# Patient Record
Sex: Male | Born: 1990 | Race: White | Hispanic: No | Smoking: Never smoker
Health system: Southern US, Community
[De-identification: ages and names within clinical notes are randomized; demographics above are authoritative.]

## PROBLEM LIST (undated history)

## (undated) DIAGNOSIS — J302 Other seasonal allergic rhinitis: Secondary | ICD-10-CM

## (undated) DIAGNOSIS — Z8659 Personal history of other mental and behavioral disorders: Secondary | ICD-10-CM

## (undated) HISTORY — DX: Personal history of other mental and behavioral disorders: Z86.59

## (undated) HISTORY — DX: Other seasonal allergic rhinitis: J30.2

---

## 2009-11-15 HISTORY — PX: APPENDECTOMY: SHX54

## 2010-04-02 ENCOUNTER — Emergency Department (HOSPITAL_COMMUNITY): Admission: EM | Admit: 2010-04-02 | Discharge: 2010-04-03 | Payer: Self-pay | Admitting: Emergency Medicine

## 2010-06-19 ENCOUNTER — Encounter (INDEPENDENT_AMBULATORY_CARE_PROVIDER_SITE_OTHER): Payer: Self-pay | Admitting: Surgery

## 2010-06-19 ENCOUNTER — Observation Stay (HOSPITAL_COMMUNITY): Admission: EM | Admit: 2010-06-19 | Discharge: 2010-06-20 | Payer: Self-pay | Admitting: Emergency Medicine

## 2010-12-06 ENCOUNTER — Encounter: Payer: Self-pay | Admitting: Pediatrics

## 2011-01-29 LAB — COMPREHENSIVE METABOLIC PANEL
ALT: 13 U/L (ref 0–53)
BUN: 6 mg/dL (ref 6–23)
Calcium: 9.5 mg/dL (ref 8.4–10.5)
Chloride: 102 mEq/L (ref 96–112)
Creatinine, Ser: 0.78 mg/dL (ref 0.4–1.5)
GFR calc Af Amer: >60 mL/min (ref >60)
GFR calc non Af Amer: >60 mL/min (ref >60)
Potassium: 3.5 mEq/L (ref 3.5–5.1)
Sodium: 138 mEq/L (ref 135–145)
Total Protein: 7.8 g/dL (ref 6.0–8.3)

## 2011-01-29 LAB — CBC
HCT: 43.8 % (ref 39.0–52.0)
Hemoglobin: 15 g/dL (ref 13.0–17.0)
MCH: 28.6 pg (ref 26.0–34.0)
MCV: 83.8 fL (ref 78.0–100.0)
Platelets: 177 10*3/uL (ref 150–400)
WBC: 13.7 10*3/uL — ABNORMAL HIGH (ref 4.0–10.5)

## 2011-01-29 LAB — URINALYSIS, ROUTINE W REFLEX MICROSCOPIC
Bilirubin Urine: NEGATIVE
Glucose, UA: NEGATIVE mg/dL
Nitrite: NEGATIVE
Urobilinogen, UA: 0.2 mg/dL (ref 0.0–1.0)
pH: 8 (ref 5.0–8.0)

## 2011-01-29 LAB — DIFFERENTIAL
Basophils Absolute: 0 10*3/uL (ref 0.0–0.1)
Eosinophils Absolute: 0 10*3/uL (ref 0.0–0.7)
Eosinophils Relative: 0 % (ref 0–5)
Lymphs Abs: 0.9 10*3/uL (ref 0.7–4.0)
Neutro Abs: 11.6 10*3/uL — ABNORMAL HIGH (ref 1.7–7.7)
Neutrophils Relative %: 85 % — ABNORMAL HIGH (ref 43–77)

## 2011-01-29 LAB — URINE MICROSCOPIC-ADD ON

## 2011-02-01 LAB — POCT I-STAT, CHEM 8
BUN: 10 mg/dL (ref 6–23)
Chloride: 101 mEq/L (ref 96–112)
Glucose, Bld: 114 mg/dL — ABNORMAL HIGH (ref 70–99)
HCT: 46 % (ref 39.0–52.0)
Hemoglobin: 15.6 g/dL (ref 13.0–17.0)
Potassium: 3.8 mEq/L (ref 3.5–5.1)
Sodium: 135 mEq/L (ref 135–145)

## 2011-02-01 LAB — DIFFERENTIAL
Basophils Absolute: 0 10*3/uL (ref 0.0–0.1)
Eosinophils Absolute: 0 10*3/uL (ref 0.0–0.7)
Eosinophils Relative: 0 % (ref 0–5)
Lymphocytes Relative: 4 % — ABNORMAL LOW (ref 12–46)
Monocytes Absolute: 1.6 10*3/uL — ABNORMAL HIGH (ref 0.1–1.0)
Neutrophils Relative %: 85 % — ABNORMAL HIGH (ref 43–77)

## 2011-02-01 LAB — CBC
HCT: 43.7 % (ref 39.0–52.0)
Hemoglobin: 14.8 g/dL (ref 13.0–17.0)
Platelets: 164 10*3/uL (ref 150–400)
RDW: 15.1 % (ref 11.5–15.5)
WBC: 14.6 10*3/uL — ABNORMAL HIGH (ref 4.0–10.5)

## 2012-11-15 HISTORY — PX: WISDOM TOOTH EXTRACTION: SHX21

## 2013-04-06 ENCOUNTER — Other Ambulatory Visit (HOSPITAL_COMMUNITY): Payer: Self-pay | Admitting: Internal Medicine

## 2013-04-06 ENCOUNTER — Ambulatory Visit (HOSPITAL_COMMUNITY)
Admission: RE | Admit: 2013-04-06 | Discharge: 2013-04-06 | Disposition: A | Discharge status: Home / Self Care | Payer: BC Managed Care – PPO | Source: Ambulatory Visit | Attending: Internal Medicine | Admitting: Internal Medicine

## 2013-04-06 DIAGNOSIS — M79609 Pain in unspecified limb: Secondary | ICD-10-CM

## 2013-04-13 ENCOUNTER — Other Ambulatory Visit: Payer: Self-pay | Admitting: Internal Medicine

## 2013-04-13 DIAGNOSIS — R609 Edema, unspecified: Secondary | ICD-10-CM

## 2013-04-16 ENCOUNTER — Other Ambulatory Visit: Payer: BC Managed Care – PPO

## 2013-10-19 ENCOUNTER — Telehealth: Payer: Self-pay

## 2013-10-19 NOTE — Telephone Encounter (Signed)
The patient is hoping to be worked in as a new pt. His mother stated he was having emotional issues, and she was advised to find him a pcp 1st.  Do you want him worked into your schedule?   Thanks!

## 2013-10-19 NOTE — Telephone Encounter (Signed)
ok 

## 2014-02-16 IMAGING — CR DG TIBIA/FIBULA 2V*L*
2 series · 2 of 2 positions shown · non-contrast
Comparison: None.

CLINICAL DATA: Persistent left lower leg pain following injury 3
weeks ago.

LEFT TIBIA AND FIBULA - 2 VIEW

[view not recorded (1 of 2)]
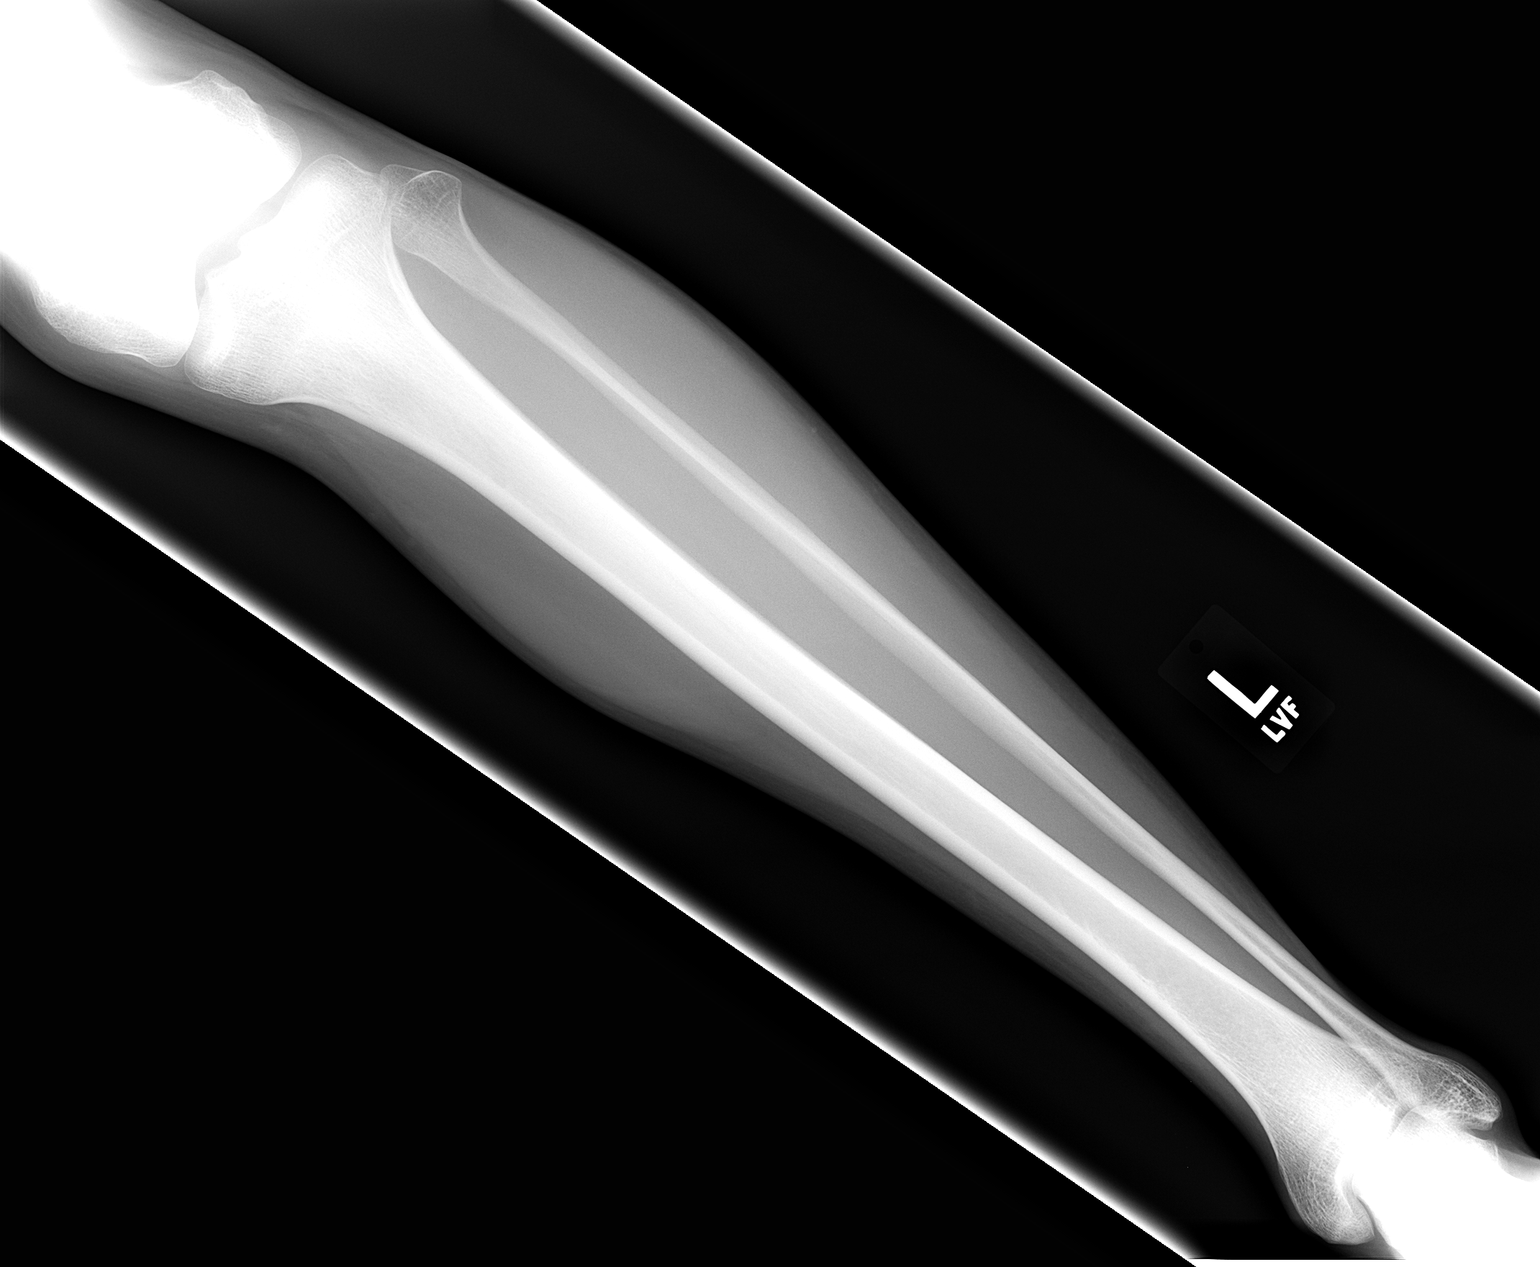

[view not recorded (2 of 2)]
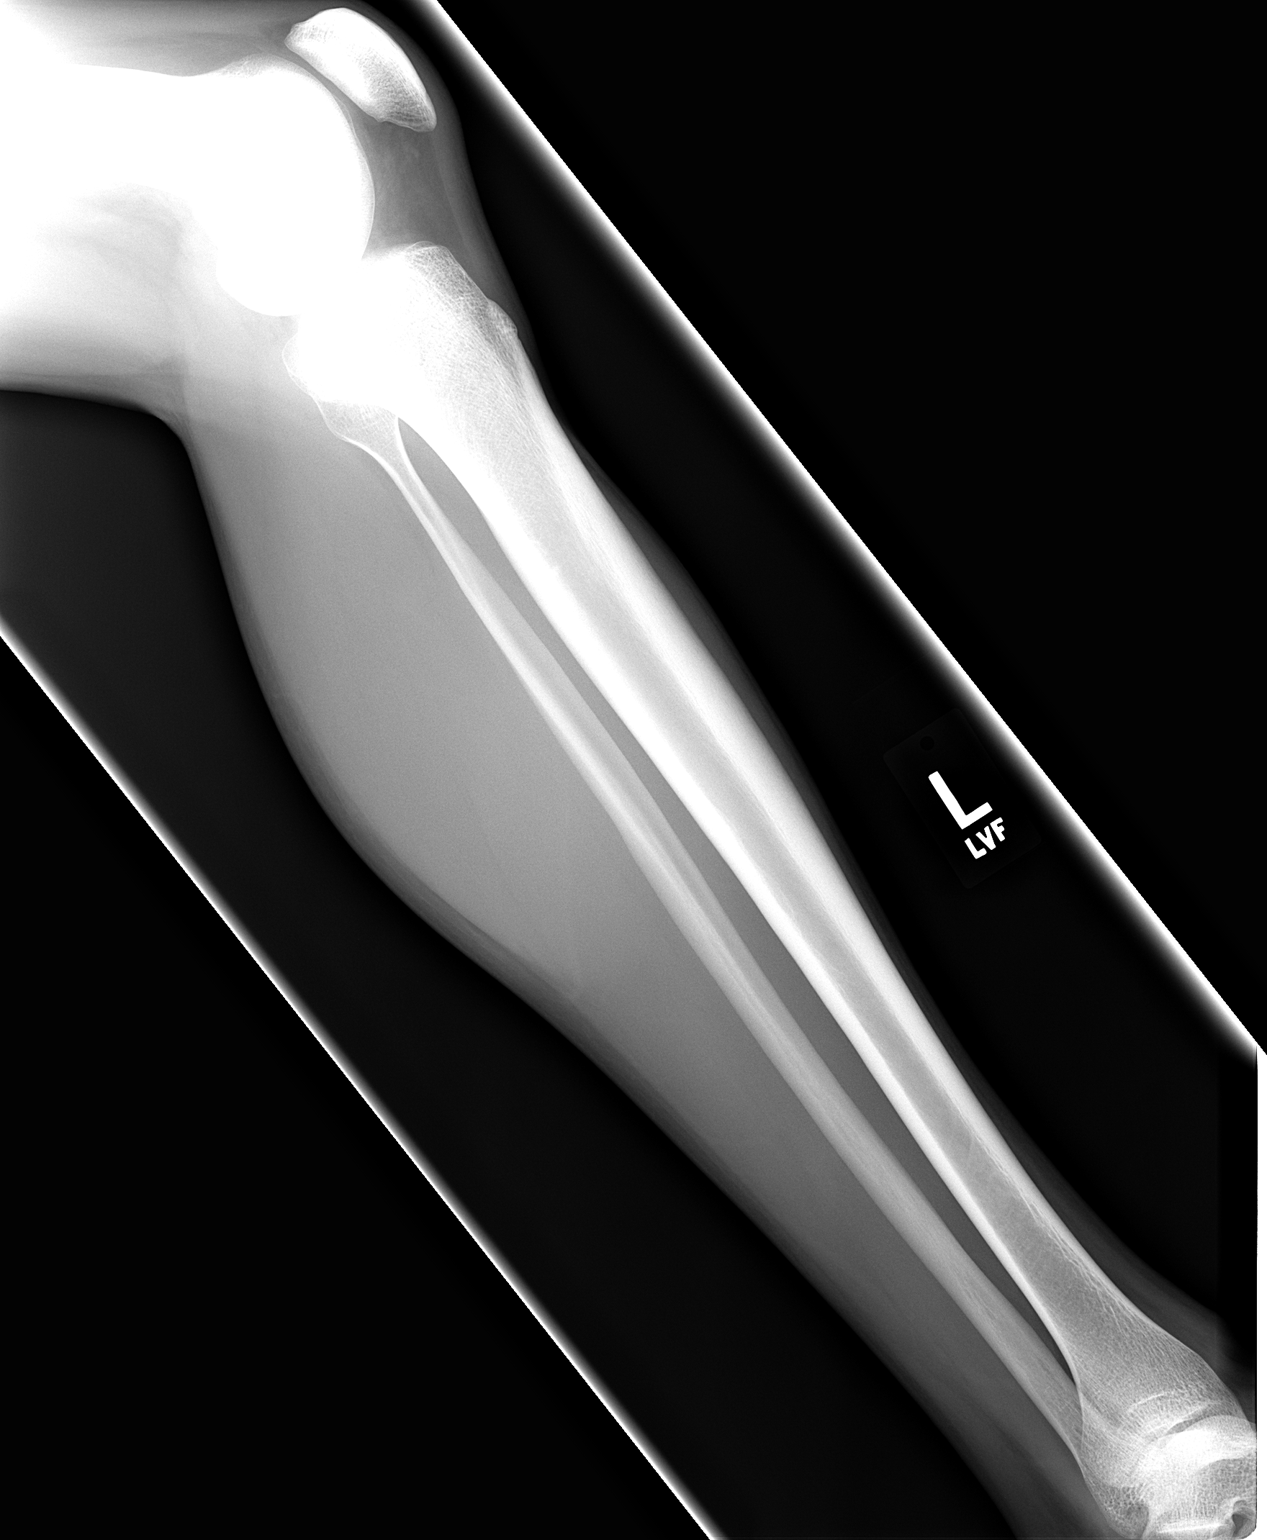

[2 of 2 positions shown; findings below may reference images not displayed]

FINDINGS: The mineralization and alignment are normal.  There is no
evidence of acute fracture or dislocation.  No focal soft tissue
swelling or foreign body is identified.
IMPRESSION: No acute osseous findings.

## 2014-05-29 ENCOUNTER — Ambulatory Visit: Payer: BC Managed Care – PPO | Admitting: Internal Medicine

## 2014-11-05 ENCOUNTER — Encounter: Payer: Self-pay | Admitting: Internal Medicine

## 2014-11-05 ENCOUNTER — Ambulatory Visit (INDEPENDENT_AMBULATORY_CARE_PROVIDER_SITE_OTHER): Payer: BC Managed Care – PPO | Admitting: Internal Medicine

## 2014-11-05 ENCOUNTER — Other Ambulatory Visit (INDEPENDENT_AMBULATORY_CARE_PROVIDER_SITE_OTHER): Payer: BC Managed Care – PPO

## 2014-11-05 VITALS — BP 130/78 | HR 99 | Temp 98.1°F | Ht 69.0 in | Wt 117.0 lb

## 2014-11-05 DIAGNOSIS — F959 Tic disorder, unspecified: Secondary | ICD-10-CM

## 2014-11-05 DIAGNOSIS — Z0000 Encounter for general adult medical examination without abnormal findings: Secondary | ICD-10-CM

## 2014-11-05 LAB — LIPID PANEL
CHOL/HDL RATIO: 4
CHOLESTEROL: 215 mg/dL — AB (ref 0–200)
HDL: 57.6 mg/dL (ref >39.00)
LDL Cholesterol: 144 mg/dL — ABNORMAL HIGH (ref 0–99)
NonHDL: 157.4
Triglycerides: 65 mg/dL (ref 0.0–149.0)
VLDL: 13 mg/dL (ref 0.0–40.0)

## 2014-11-05 LAB — CBC WITH DIFFERENTIAL/PLATELET
BASOS ABS: 0 10*3/uL (ref 0.0–0.1)
Basophils Relative: 0.5 % (ref 0.0–3.0)
EOS PCT: 2.9 % (ref 0.0–5.0)
Eosinophils Absolute: 0.2 10*3/uL (ref 0.0–0.7)
HCT: 46.2 % (ref 39.0–52.0)
Hemoglobin: 15.2 g/dL (ref 13.0–17.0)
Lymphocytes Relative: 20.2 % (ref 12.0–46.0)
Lymphs Abs: 1.6 10*3/uL (ref 0.7–4.0)
MCHC: 33 g/dL (ref 30.0–36.0)
MCV: 85.3 fl (ref 78.0–100.0)
Monocytes Absolute: 0.8 10*3/uL (ref 0.1–1.0)
Monocytes Relative: 10.9 % (ref 3.0–12.0)
NEUTROS ABS: 5.1 10*3/uL (ref 1.4–7.7)
NEUTROS PCT: 65.5 % (ref 43.0–77.0)
Platelets: 225 10*3/uL (ref 150.0–400.0)
RBC: 5.41 Mil/uL (ref 4.22–5.81)
RDW: 14.1 % (ref 11.5–15.5)
WBC: 7.7 10*3/uL (ref 4.0–10.5)

## 2014-11-05 LAB — TSH: TSH: 2.13 u[IU]/mL (ref 0.35–4.50)

## 2014-11-05 LAB — URINALYSIS, ROUTINE W REFLEX MICROSCOPIC
Bilirubin Urine: NEGATIVE
Hgb urine dipstick: NEGATIVE
Ketones, ur: NEGATIVE
Leukocytes, UA: NEGATIVE
NITRITE: NEGATIVE
PH: 6 (ref 5.0–8.0)
RBC / HPF: NONE SEEN (ref 0–?)
Specific Gravity, Urine: 1.015 (ref 1.000–1.030)
TOTAL PROTEIN, URINE-UPE24: NEGATIVE
URINE GLUCOSE: NEGATIVE
UROBILINOGEN UA: 0.2 (ref 0.0–1.0)

## 2014-11-05 LAB — HEPATIC FUNCTION PANEL
ALK PHOS: 47 U/L (ref 39–117)
ALT: 22 U/L (ref 0–53)
AST: 24 U/L (ref 0–37)
Albumin: 4.6 g/dL (ref 3.5–5.2)
Bilirubin, Direct: 0.1 mg/dL (ref 0.0–0.3)
Total Bilirubin: 0.5 mg/dL (ref 0.2–1.2)
Total Protein: 7.7 g/dL (ref 6.0–8.3)

## 2014-11-05 LAB — BASIC METABOLIC PANEL
BUN: 14 mg/dL (ref 6–23)
CALCIUM: 9.4 mg/dL (ref 8.4–10.5)
CHLORIDE: 104 meq/L (ref 96–112)
CO2: 26 meq/L (ref 19–32)
Creatinine, Ser: 0.8 mg/dL (ref 0.4–1.5)
GFR: 127.96 mL/min (ref >60.00)
GLUCOSE: 95 mg/dL (ref 70–99)
POTASSIUM: 4.1 meq/L (ref 3.5–5.1)
SODIUM: 139 meq/L (ref 135–145)

## 2014-11-05 MED ORDER — CLONIDINE HCL 0.1 MG PO TABS: 0.1000 mg | ORAL_TABLET | Freq: Every day | ORAL | Status: DC

## 2014-11-05 NOTE — Patient Instructions (Addendum)
It was good to see you today.  We have reviewed your prior records including labs and tests today  Health Maintenance reviewed - all recommended immunizations and age-appropriate screenings are up-to-date.  Test(s) ordered today. Your results will be released to [place] (or called to you) after review, usually within 72hours after test completion. If any changes need to be made, you will be notified at that same time.  Medications reviewed and updated Will start low-dose clonidine at bedtime to help decrease tics, no other medication changes recommended at this time.  we'll make referral to behavioral health to help evaluate advice in treatment for management of tics. Our office will contact you regarding appointment(s) once made.  Please schedule followup in 3 months for follow up and med review call sooner if problems.  Health Maintenance A healthy lifestyle and preventative care can promote health and wellness.  Maintain regular health, dental, and eye exams.  Eat a healthy diet. Foods like vegetables, fruits, whole grains, low-fat dairy products, and lean protein foods contain the nutrients you need and are low in calories. Decrease your intake of foods high in solid fats, added sugars, and salt. Get information about a proper diet from your health care provider, if necessary.  Regular physical exercise is one of the most important things you can do for your health. Most adults should get at least 150 minutes of moderate-intensity exercise (any activity that increases your heart rate and causes you to sweat) each week. In addition, most adults need muscle-strengthening exercises on 2 or more days a week.   Maintain a healthy weight. The body mass index (BMI) is a screening tool to identify possible weight problems. It provides an estimate of body fat based on height and weight. Your health care provider can find your BMI and can help you achieve or maintain a healthy weight. For males 20  years and older:  A BMI below 18.5 is considered underweight.  A BMI of 18.5 to 24.9 is normal.  A BMI of 25 to 29.9 is considered overweight.  A BMI of 30 and above is considered obese.  Maintain normal blood lipids and cholesterol by exercising and minimizing your intake of saturated fat. Eat a balanced diet with plenty of fruits and vegetables. Blood tests for lipids and cholesterol should begin at age 108 and be repeated every 5 years. If your lipid or cholesterol levels are high, you are over age 55, or you are at high risk for heart disease, you may need your cholesterol levels checked more frequently.Ongoing high lipid and cholesterol levels should be treated with medicines if diet and exercise are not working.  If you smoke, find out from your health care provider how to quit. If you do not use tobacco, do not start.  Lung cancer screening is recommended for adults aged 20-80 years who are at high risk for developing lung cancer because of a history of smoking. A yearly low-dose CT scan of the lungs is recommended for people who have at least a 30-pack-year history of smoking and are current smokers or have quit within the past 15 years. A pack year of smoking is smoking an average of 1 pack of cigarettes a day for 1 year (for example, a 30-pack-year history of smoking could mean smoking 1 pack a day for 30 years or 2 packs a day for 15 years). Yearly screening should continue until the smoker has stopped smoking for at least 15 years. Yearly screening should be stopped for  people who develop a health problem that would prevent them from having lung cancer treatment.  If you choose to drink alcohol, do not have more than 2 drinks per day. One drink is considered to be 12 oz (360 mL) of beer, 5 oz (150 mL) of wine, or 1.5 oz (45 mL) of liquor.  Avoid the use of street drugs. Do not share needles with anyone. Ask for help if you need support or instructions about stopping the use of  drugs.  High blood pressure causes heart disease and increases the risk of stroke. Blood pressure should be checked at least every 1-2 years. Ongoing high blood pressure should be treated with medicines if weight loss and exercise are not effective.  If you are 56-75 years old, ask your health care provider if you should take aspirin to prevent heart disease.  Diabetes screening involves taking a blood sample to check your fasting blood sugar level. This should be done once every 3 years after age 65 if you are at a normal weight and without risk factors for diabetes. Testing should be considered at a younger age or be carried out more frequently if you are overweight and have at least 1 risk factor for diabetes.  Colorectal cancer can be detected and often prevented. Most routine colorectal cancer screening begins at the age of 76 and continues through age 56. However, your health care provider may recommend screening at an earlier age if you have risk factors for colon cancer. On a yearly basis, your health care provider may provide home test kits to check for hidden blood in the stool. A small camera at the end of a tube may be used to directly examine the colon (sigmoidoscopy or colonoscopy) to detect the earliest forms of colorectal cancer. Talk to your health care provider about this at age 77 when routine screening begins. A direct exam of the colon should be repeated every 5-10 years through age 41, unless early forms of precancerous polyps or small growths are found.  People who are at an increased risk for hepatitis B should be screened for this virus. You are considered at high risk for hepatitis B if:  You were born in a country where hepatitis B occurs often. Talk with your health care provider about which countries are considered high risk.  Your parents were born in a high-risk country and you have not received a shot to protect against hepatitis B (hepatitis B vaccine).  You have HIV  or AIDS.  You use needles to inject street drugs.  You live with, or have sex with, someone who has hepatitis B.  You are a man who has sex with other men (MSM).  You get hemodialysis treatment.  You take certain medicines for conditions like cancer, organ transplantation, and autoimmune conditions.  Hepatitis C blood testing is recommended for all people born from [birth year] through 1966 and any individual with known risk factors for hepatitis C.  Healthy men should no longer receive prostate-specific antigen (PSA) blood tests as part of routine cancer screening. Talk to your health care provider about prostate cancer screening.  Testicular cancer screening is not recommended for adolescents or adult males who have no symptoms. Screening includes self-exam, a health care provider exam, and other screening tests. Consult with your health care provider about any symptoms you have or any concerns you have about testicular cancer.  Practice safe sex. Use condoms and avoid high-risk sexual practices to reduce the spread of sexually transmitted  infections (STIs).  You should be screened for STIs, including gonorrhea and chlamydia if:  You are sexually active and are younger than 24 years.  You are older than 24 years, and your health care provider tells you that you are at risk for this type of infection.  Your sexual activity has changed since you were last screened, and you are at an increased risk for chlamydia or gonorrhea. Ask your health care provider if you are at risk.  If you are at risk of being infected with HIV, it is recommended that you take a prescription medicine daily to prevent HIV infection. This is called pre-exposure prophylaxis (PrEP). You are considered at risk if:  You are a man who has sex with other men (MSM).  You are a heterosexual man who is sexually active with multiple partners.  You take drugs by injection.  You are sexually active with a partner who has  HIV.  Talk with your health care provider about whether you are at high risk of being infected with HIV. If you choose to begin PrEP, you should first be tested for HIV. You should then be tested every 3 months for as long as you are taking PrEP.  Use sunscreen. Apply sunscreen liberally and repeatedly throughout the day. You should seek shade when your shadow is shorter than you. Protect yourself by wearing long sleeves, pants, a wide-brimmed hat, and sunglasses year round whenever you are outdoors.  Tell your health care provider of new moles or changes in moles, especially if there is a change in shape or color. Also, tell your health care provider if a mole is larger than the size of a pencil eraser.  A one-time screening for abdominal aortic aneurysm (AAA) and surgical repair of large AAAs by ultrasound is recommended for men aged 25-75 years who are current or former smokers.  Stay current with your vaccines (immunizations). Document Released: 04/29/2008 Document Revised: 11/06/2013 Document Reviewed: 03/29/2011 [facility] Patient Information 2015 [place], [place]. This information is not intended to replace advice given to you by your health care provider. Make sure you discuss any questions you have with your health care provider. Dystonias The dystonias are movement disorders in which sustained muscle contractions cause twisting and repetitive movements or abnormal postures. The movements, which are involuntary and sometimes painful, may affect a single muscle; a group of muscles such as those in the arms, legs, or neck; or the entire body. Early symptoms (problems) may include a deterioration in handwriting after writing several lines, foot cramps, and a tendency of one foot to pull up or drag after running or walking some distance. Other possible symptoms are tremor and voice or speech difficulties. Birth injury (particularly due to lack of oxygen), certain infections, reactions to certain  drugs, heavy-metal or carbon monoxide poisoning, trauma (damage caused by an accident), or stroke can cause dystonic symptoms. About half the cases of dystonia have no connection to disease or injury and are called primary or idiopathic dystonia. Of the primary dystonias, many cases appear to be inherited in a dominant manner. Dystonias can also be symptoms of other diseases, some of which may be hereditary (passed down from parents). In some individuals, symptoms of a dystonia appear spontaneously in childhood between the ages of 66 and 65, usually in the foot or in the hand. For other individuals, the symptoms emerge in late adolescence or early adulthood. TREATMENT  No one treatment has been found universally effective for dystonia. Instead, physicians use a  variety of therapies (medications, surgery and other treatments such as physical therapy, splinting, stress management, and biofeedback), aimed at reducing or eliminating muscle spasms and pain. Since response to drugs varies among patients and even in the same person over time, the therapy must be individualized. PROGNOSIS The initial symptoms can be very mild and may be noticeable only after prolonged exertion, stress, or fatigue. Over a period of time, the symptoms may become more noticeable and widespread and be unrelenting; sometimes, however, there is little or no progression. RESEARCH BEING DONE Investigators believe that the dystonias result from an abnormality in an area of the brain called the basal ganglia, where some of the messages that initiate muscle contractions are processed. Scientists suspect a defect in the body's ability to process a group of chemicals called neurotransmitters that help cells in the brain communicate with each other. Scientists at the [facility] have conducted detailed investigations of the pattern of muscle activity in persons with dystonias. Studies using EEG analysis and neuroimaging are probing brain  activity. The search for the gene or genes responsible for some forms of dominantly inherited dystonias continues. 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 In 1997, the team sequenced the DYT1 gene and found that it codes for a previously unknown protein now called "torsin A." Document Released: 10/22/2002 Document Revised: 01/24/2012 Document Reviewed: 12/26/2013 [facility] Patient Information 2015 [place], [place]. This information is not intended to replace advice given to you by your health care provider. Make sure you discuss any questions you have with your health care provider.

## 2014-11-05 NOTE — Progress Notes (Signed)
Pre visit review using our clinic review tool, if applicable. No additional management support is needed unless otherwise documented below in the visit note. 

## 2014-11-05 NOTE — Progress Notes (Signed)
Subjective:    Patient ID: Howard West, male    DOB: [birth year], 23 y.o.   MRN: [id number]  HPI  New patient to me, here to establish care patient is here today for annual physical. Patient feels well in general -has had health maintenance issues followed at student [facility]  Concerned about nervous tic habit. Same noted by patient, his mother and girlfriend -? Progressively worse in past 12 months. Ongoing symptoms since 23 years of age. Describes behavior as involuntary jerk of head or vocalized "breath" which causes dry mouth. Symptoms worse with stress or sleep deprivation. Denies concurrent anxiety, depression. No prior diagnosis her behavior of OCD or ADHD  Past Medical History  Diagnosis Date  . Seasonal allergies   . History of depression     15-16yo, on meds x 81mo before HS graduation     Family History  Problem Relation Age of Onset  . Osteoarthritis Mother   . Anxiety disorder Mother   . Hyperlipidemia Father   . Alcoholism Father   . Alcohol abuse Maternal Grandfather   . Coronary artery disease Maternal Grandfather   . Congenital heart disease Brother   . Anxiety disorder Sister     History  Substance Use Topics  . Smoking status: Never Smoker   . Smokeless tobacco: Not on file  . Alcohol Use: 0.0 oz/week    0 Not specified per week    Review of Systems  Constitutional: Negative for fever, activity change, appetite change, fatigue and unexpected weight change.  Respiratory: Negative for cough, chest tightness, shortness of breath and wheezing.   Cardiovascular: Negative for chest pain, palpitations and leg swelling.  Neurological: Negative for dizziness, weakness and headaches.  Psychiatric/Behavioral: Negative for suicidal ideas, confusion, sleep disturbance, self-injury, dysphoric mood and decreased concentration. The patient is not nervous/anxious (hx same, but denies current sx).   All other systems reviewed and are negative.      Objective:   Physical Exam  BP 130/78 mmHg  Pulse 99  Temp(Src) 98.1 F (36.7 C) (Oral)  Ht 5\' 9"  (1.753 m)  Wt 117 lb (53.071 kg)  BMI 17.27 kg/m2  SpO2 98% Wt Readings from Last 3 Encounters:  11/05/14 117 lb (53.071 kg)   Constitutional: he is thin, appears well-developed and well-nourished. No distress.  infrequently observed twitch of head or soft auditory "throat clear" HENT: Head: Normocephalic and atraumatic. Ears: B TMs ok, no erythema or effusion; Nose: Nose normal. Mouth/Throat: Oropharynx is clear and moist. No oropharyngeal exudate.  Eyes: Conjunctivae and EOM are normal. Pupils are equal, round, and reactive to light. No scleral icterus.  Neck: Normal range of motion. Neck supple. No JVD present. No thyromegaly present.  Cardiovascular: Normal rate, regular rhythm and normal heart sounds.  No murmur heard. No BLE edema. Pulmonary/Chest: Effort normal and breath sounds normal. No respiratory distress. he has no wheezes.  Abdominal: Soft. Bowel sounds are normal. he exhibits no distension. There is no tenderness. no masses GU: defer Musculoskeletal: Normal range of motion, no joint effusions. No gross deformities Neurological: he is alert and oriented to person, place, and time. No cranial nerve deficit. Coordination, balance, strength, speech and gait are normal.  Skin: Skin is warm and dry. No rash noted. No erythema.  Psychiatric: he has a normal mood and affect. behavior is normal. Judgment and thought content normal.  Lab Results  Component Value Date   WBC 13.7* 06/19/2010   HGB 15.0 06/19/2010   HCT 43.8 06/19/2010  PLT 177 06/19/2010   GLUCOSE 109* 06/19/2010   ALT 13 06/19/2010   AST 18 06/19/2010   NA 138 06/19/2010   K 3.5 06/19/2010   CL 102 06/19/2010   CREATININE 0.78 06/19/2010   BUN 6 06/19/2010   CO2 25 06/19/2010    Dg Tibia/fibula Left  04/06/2013   *RADIOLOGY REPORT*  Clinical Data: Persistent left lower leg pain following injury 3 weeks ago.  LEFT  TIBIA AND FIBULA - 2 VIEW  Comparison: None.  Findings: The mineralization and alignment are normal.  There is no evidence of acute fracture or dislocation.  No focal soft tissue swelling or foreign body is identified.  IMPRESSION: No acute osseous findings.   Original Report Authenticated By: Carey BullocksWilliam Veazey, M.D.       Assessment & Plan:   CPX/z00.00 - Patient has been counseled on age-appropriate routine health concerns for screening and prevention. These are reviewed and up-to-date. Immunizations are up-to-date or declined. Labs ordered and reviewed.  Problem List Items Addressed This Visit    Tic disorder    Ongoing symptoms since age 23 or [age]. Manifest with head twitch and soft vocalization/throat clearing which causes dry throat. Refer for behavioral therapy. Begin low-dose clonidine at bedtime for adrenergic management. Further med titration or adjustment per conjunction with behavioral health advice -reassurance provided, education provided    Relevant Orders      Ambulatory referral to Psychology    Other Visit Diagnoses    Routine general medical examination at a health care facility    -  Primary    Relevant Orders       Basic metabolic panel       Hepatic function panel       Lipid panel       Urinalysis, Routine w reflex microscopic       TSH       CBC with Differential

## 2014-11-05 NOTE — Assessment & Plan Note (Signed)
Ongoing symptoms since age 23 or [age]. Manifest with head twitch and soft vocalization/throat clearing which causes dry throat. Refer for behavioral therapy. Begin low-dose clonidine at bedtime for adrenergic management. Further med titration or adjustment per conjunction with behavioral health advice -reassurance provided, education provided

## 2014-11-26 ENCOUNTER — Ambulatory Visit (INDEPENDENT_AMBULATORY_CARE_PROVIDER_SITE_OTHER): Payer: BLUE CROSS/BLUE SHIELD | Admitting: Psychiatry

## 2014-11-26 DIAGNOSIS — F959 Tic disorder, unspecified: Secondary | ICD-10-CM

## 2014-11-27 ENCOUNTER — Telehealth: Payer: Self-pay | Admitting: Internal Medicine

## 2014-11-27 NOTE — Telephone Encounter (Signed)
Pt called in said that he seen therapist and he is requesting something for depression

## 2014-11-28 MED ORDER — FLUOXETINE HCL 20 MG PO TABS
20.0000 mg | ORAL_TABLET | Freq: Every day | ORAL | Status: DC
Start: 2014-11-28 — End: 2015-02-04

## 2014-11-28 NOTE — Telephone Encounter (Signed)
Pt informed of erx

## 2014-11-28 NOTE — Telephone Encounter (Signed)
Thanks for the message - Ok to start generic prozac 20mg  daily -= erx to pharmacy

## 2015-02-03 ENCOUNTER — Ambulatory Visit: Payer: BC Managed Care – PPO | Admitting: Internal Medicine

## 2015-02-04 ENCOUNTER — Encounter: Payer: Self-pay | Admitting: Internal Medicine

## 2015-02-04 ENCOUNTER — Ambulatory Visit (INDEPENDENT_AMBULATORY_CARE_PROVIDER_SITE_OTHER): Payer: BLUE CROSS/BLUE SHIELD | Admitting: Internal Medicine

## 2015-02-04 VITALS — BP 114/60 | HR 83 | Temp 98.2°F | Resp 14 | Ht 70.0 in | Wt 113.4 lb

## 2015-02-04 DIAGNOSIS — F959 Tic disorder, unspecified: Secondary | ICD-10-CM

## 2015-02-04 MED ORDER — FLUOXETINE HCL 20 MG PO TABS: 20.0000 mg | ORAL_TABLET | Freq: Every day | ORAL | Status: DC

## 2015-02-04 NOTE — Progress Notes (Signed)
Pre visit review using our clinic review tool, if applicable. No additional management support is needed unless otherwise documented below in the visit note. 

## 2015-02-04 NOTE — Patient Instructions (Signed)
Come back in about 6 months to follow up. If you have any problems or questions before then please call our office back.

## 2015-02-04 NOTE — Assessment & Plan Note (Addendum)
Doing well with prozac and not taking the clonidine (will remove from medication list). Refilled for 6 months and advised follow up in about 6 months to see if he needs any adjustment. No dosing adjustment today.

## 2015-02-04 NOTE — Progress Notes (Signed)
   Subjective:    Patient ID: Howard FiddlerJames A West, male    DOB: [birth year], 24 y.o.   MRN: [id number]  HPI The patient is a 24 YO man who is coming in to follow up on his tic disorder. He is taking prozac now and feels like this is helping some. He denies any adverse side effects from the medications. Less tics and more controllable at this time. Denies weight gain or loss. Denies sexual dysfunction. Rates his tics as mild now from moderate. Happy with where he is.   Review of Systems  Constitutional: Negative for fever, activity change, appetite change, fatigue and unexpected weight change.  Respiratory: Negative.   Neurological: Negative.   Psychiatric/Behavioral: Negative.       Objective:   Physical Exam  Constitutional: He appears well-developed and well-nourished.  No tic during our conversation  HENT:  Head: Normocephalic and atraumatic.  Cardiovascular: Normal rate and regular rhythm.   Pulmonary/Chest: Effort normal and breath sounds normal.  Psychiatric: He has a normal mood and affect.   Filed Vitals:   02/04/15 0813  BP: 114/60  Pulse: 83  Temp: 98.2 F (36.8 C)  TempSrc: Oral  Resp: 14  Height: 5\' 10"  (1.778 m)  Weight: 113 lb 6.4 oz (51.438 kg)  SpO2: 99%      Assessment & Plan:

## 2015-08-11 ENCOUNTER — Ambulatory Visit: Payer: BLUE CROSS/BLUE SHIELD | Admitting: Internal Medicine

## 2015-08-14 ENCOUNTER — Encounter: Payer: Self-pay | Admitting: Internal Medicine

## 2015-08-14 ENCOUNTER — Ambulatory Visit (INDEPENDENT_AMBULATORY_CARE_PROVIDER_SITE_OTHER): Payer: BLUE CROSS/BLUE SHIELD | Admitting: Internal Medicine

## 2015-08-14 VITALS — BP 128/60 | HR 108 | Temp 98.6°F | Resp 12 | Ht 68.0 in | Wt 121.0 lb

## 2015-08-14 DIAGNOSIS — F959 Tic disorder, unspecified: Secondary | ICD-10-CM | POA: Diagnosis not present

## 2015-08-14 MED ORDER — FLUOXETINE HCL 20 MG PO TABS: 20.0000 mg | ORAL_TABLET | Freq: Every day | ORAL | Status: DC

## 2015-08-14 NOTE — Progress Notes (Signed)
Pre visit review using our clinic review tool, if applicable. No additional management support is needed unless otherwise documented below in the visit note. 

## 2015-08-14 NOTE — Assessment & Plan Note (Signed)
Doing well on prozac still. Refilled for 1 year. No labs indicated today.

## 2015-08-14 NOTE — Patient Instructions (Signed)
Come back in about 1 year

## 2015-08-14 NOTE — Progress Notes (Signed)
   Subjective:    Patient ID: Howard West, male    DOB: Oct 19, 1991, 24 y.o.   MRN: [id number]  HPI The patient is a 24 YO man coming in for follow up of his tic disorder. Doing well with prozac. No complaints. No side effects. Happy with the results. Does not take flu shot. No new conditions or injuries.   Review of Systems  Constitutional: Negative for fever, activity change, appetite change, fatigue and unexpected weight change.  Respiratory: Negative.   Cardiovascular: Negative.   Gastrointestinal: Negative.   Musculoskeletal: Negative.   Neurological: Negative.   Psychiatric/Behavioral: Negative.       Objective:   Physical Exam  Constitutional: He is oriented to person, place, and time. He appears well-developed and well-nourished.  No tic during our conversation  HENT:  Head: Normocephalic and atraumatic.  Cardiovascular: Normal rate and regular rhythm.   Pulmonary/Chest: Effort normal and breath sounds normal.  Abdominal: Soft. He exhibits no distension. There is no tenderness. There is no rebound.  Musculoskeletal: He exhibits no edema.  Neurological: He is alert and oriented to person, place, and time. Coordination normal.  Skin: Skin is warm and dry.  Psychiatric: He has a normal mood and affect.   Filed Vitals:   08/14/15 0849  BP: 128/60  Pulse: 108  Temp: 98.6 F (37 C)  TempSrc: Oral  Resp: 12  Height:  (1.727 m)  Weight: 121 lb (54.885 kg)  SpO2: 98%      Assessment & Plan:

## 2015-08-15 ENCOUNTER — Encounter: Payer: Self-pay | Admitting: Internal Medicine

## 2015-08-15 ENCOUNTER — Ambulatory Visit (INDEPENDENT_AMBULATORY_CARE_PROVIDER_SITE_OTHER): Payer: BLUE CROSS/BLUE SHIELD | Admitting: Internal Medicine

## 2015-08-15 VITALS — BP 114/68 | HR 110 | Temp 98.7°F | Wt 118.0 lb

## 2015-08-15 DIAGNOSIS — K529 Noninfective gastroenteritis and colitis, unspecified: Secondary | ICD-10-CM | POA: Diagnosis not present

## 2015-08-15 MED ORDER — ONDANSETRON HCL 4 MG PO TABS: 4.0000 mg | ORAL_TABLET | Freq: Three times a day (TID) | ORAL | Status: DC | PRN

## 2015-08-15 MED ORDER — LOPERAMIDE HCL 2 MG PO TABS: 2.0000 mg | ORAL_TABLET | Freq: Four times a day (QID) | ORAL | Status: DC | PRN

## 2015-08-15 NOTE — Progress Notes (Signed)
Subjective:  Patient ID: Howard West, male    DOB: 1991-01-28  Age: 24 y.o. MRN: [id number]  CC: No chief complaint on file.   HPI KAINON VARADY presents for n/v/d since last night. Feeling weak. He is s/p appendectomy  Outpatient Prescriptions Prior to Visit  Medication Sig Dispense Refill  . FLUoxetine (PROZAC) 20 MG tablet Take 1 tablet (20 mg total) by mouth daily. 30 tablet 11   No facility-administered medications prior to visit.    ROS Review of Systems  Constitutional: Positive for chills and fatigue. Negative for appetite change and unexpected weight change.  HENT: Negative for congestion, nosebleeds, sneezing, sore throat and trouble swallowing.   Eyes: Negative for itching and visual disturbance.  Respiratory: Negative for cough.   Cardiovascular: Negative for chest pain, palpitations and leg swelling.  Gastrointestinal: Positive for nausea, vomiting, abdominal pain and diarrhea. Negative for blood in stool, abdominal distention, anal bleeding and rectal pain.  Genitourinary: Negative for frequency and hematuria.  Musculoskeletal: Negative for back pain, joint swelling, gait problem and neck pain.  Skin: Negative for rash.  Neurological: Negative for dizziness, tremors, speech difficulty and weakness.  Psychiatric/Behavioral: Negative for suicidal ideas, sleep disturbance, dysphoric mood and agitation. The patient is not nervous/anxious.     Objective:  BP 114/68 mmHg  Pulse 110  Temp(Src) 98.7 F (37.1 C) (Oral)  Wt 118 lb (53.524 kg)  SpO2 97%  BP Readings from Last 3 Encounters:  08/15/15 114/68  08/14/15 128/60  02/04/15 114/60    Wt Readings from Last 3 Encounters:  08/15/15 118 lb (53.524 kg)  08/14/15 121 lb (54.885 kg)  02/04/15 113 lb 6.4 oz (51.438 kg)    Physical Exam  Constitutional: He is oriented to person, place, and time. He appears well-developed. No distress.  NAD  HENT:  Mouth/Throat: Oropharynx is clear and moist.  Eyes:  Conjunctivae are normal. Pupils are equal, round, and reactive to light.  Neck: Normal range of motion. No JVD present. No thyromegaly present.  Cardiovascular: Normal rate, regular rhythm, normal heart sounds and intact distal pulses.  Exam reveals no gallop and no friction rub.   No murmur heard. Pulmonary/Chest: Effort normal and breath sounds normal. No respiratory distress. He has no wheezes. He has no rales. He exhibits no tenderness.  Abdominal: Soft. Bowel sounds are normal. He exhibits no distension and no mass. There is tenderness. There is no rebound and no guarding.  Musculoskeletal: Normal range of motion. He exhibits no edema or tenderness.  Lymphadenopathy:    He has no cervical adenopathy.  Neurological: He is alert and oriented to person, place, and time. He has normal reflexes. No cranial nerve deficit. He exhibits normal muscle tone. He displays a negative Romberg sign. Coordination and gait normal.  Skin: Skin is warm and dry. No rash noted.  Psychiatric: He has a normal mood and affect. His behavior is normal. Judgment and thought content normal.  sensitive abdomen to palpation Looks tired  Lab Results  Component Value Date   WBC 7.7 11/05/2014   HGB 15.2 11/05/2014   HCT 46.2 11/05/2014   PLT 225.0 11/05/2014   GLUCOSE 95 11/05/2014   CHOL 215* 11/05/2014   TRIG 65.0 11/05/2014   HDL 57.60 11/05/2014   LDLCALC 144* 11/05/2014   ALT 22 11/05/2014   AST 24 11/05/2014   NA 139 11/05/2014   K 4.1 11/05/2014   CL 104 11/05/2014   CREATININE 0.8 11/05/2014   BUN 14 11/05/2014  CO2 26 11/05/2014   TSH 2.13 11/05/2014    Dg Tibia/fibula Left  04/06/2013   *RADIOLOGY REPORT*  Clinical Data: Persistent left lower leg pain following injury 3 weeks ago.  LEFT TIBIA AND FIBULA - 2 VIEW  Comparison: None.  Findings: The mineralization and alignment are normal.  There is no evidence of acute fracture or dislocation.  No focal soft tissue swelling or foreign body is  identified.  IMPRESSION: No acute osseous findings.   Original Report Authenticated By: Carey Bullocks, M.D.    Assessment & Plan:   Diagnoses and all orders for this visit:  Gastroenteritis  Other orders -     ondansetron (ZOFRAN) 4 MG tablet; Take 1 tablet (4 mg total) by mouth every 8 (eight) hours as needed for nausea or vomiting. -     loperamide (IMODIUM A-D) 2 MG tablet; Take 1-2 tablets (2-4 mg total) by mouth 4 (four) times daily as needed for diarrhea or loose stools.   I am having Mr. Vittorio start on ondansetron and loperamide. I am also having him maintain his FLUoxetine.  Meds ordered this encounter  Medications  . ondansetron (ZOFRAN) 4 MG tablet    Sig: Take 1 tablet (4 mg total) by mouth every 8 (eight) hours as needed for nausea or vomiting.    Dispense:  20 tablet    Refill:  0  . loperamide (IMODIUM A-D) 2 MG tablet    Sig: Take 1-2 tablets (2-4 mg total) by mouth 4 (four) times daily as needed for diarrhea or loose stools.    Dispense:  30 tablet    Refill:  0     Follow-up: No Follow-up on file.  Sonda Primes, MD

## 2015-08-15 NOTE — Patient Instructions (Signed)
Food Poisoning °Food poisoning is an illness caused by something you ate or drank. There are over 250 known causes of food poisoning. However, many other causes are unknown. You can be treated even if the exact cause of your food poisoning is not known. In most cases, food poisoning is mild and lasts 1 to 2 days. However, some cases can be serious, especially for people with low immune systems, the elderly, children and infants, and pregnant women. °CAUSES  °Poor personal hygiene, improper cleaning of storage and preparation areas, and unclean utensils can cause infection or tainting (contamination) of foods. The causes of food poisoning are numerous. Infectious agents, such as viruses, bacteria, or parasites, can cause harm by infecting the intestine and disrupting the absorption of nutrients and water. This can cause diarrhea and lead to dehydration. Viruses are responsible for most of the food poisonings in which an agent is found. Parasites are less likely to cause food poisoning. Toxic agents, such as poisonous mushrooms, marine algae, and pesticides can also cause food poisoning. °· Viral causes of food poisoning include: °¨ Norovirus. °¨ Rotavirus. °¨ Hepatitis A. °· Bacterial causes of food poisoning include: °¨ Salmonellae. °¨ Campylobacter. °¨ Bacillus cereus. °¨ Escherichia coli (E. coli). °¨ Shigella. °¨ Listeria monocytogenes. °¨ Clostridium botulinum (botulism). °¨ Vibrio cholerae. °· Parasites that can cause food poisoning include: °¨ Giardia. °¨ Cryptosporidium. °¨ Toxoplasma. °SYMPTOMS °Symptoms may appear several hours or longer after consuming the contaminated food or drink. Symptoms may include: °· Nausea. °· Vomiting. °· Cramping. °· Diarrhea. °· Fever and chills. °· Muscle aches. °DIAGNOSIS °Your health care provider may be able to diagnose food poisoning from a list of what you have recently eaten and results from lab tests. Diagnostic tests may include an exam of the feces. °TREATMENT °In  most cases, treatment focuses on helping to relieve your symptoms and staying well hydrated. Antibiotic medicines are rarely needed. In severe cases, hospitalization may be required. °HOME CARE INSTRUCTIONS  °· Drink enough water and fluids to keep your urine clear or pale yellow. Drink small amounts of fluids frequently and increase as tolerated. °· Ask your health care provider for specific rehydration instructions. °· Avoid: °¨ Foods high in sugar. °¨ Alcohol. °¨ Carbonated drinks. °¨ Tobacco. °¨ Juice. °¨ Caffeine drinks. °¨ Extremely hot or cold fluids. °¨ Fatty, greasy foods. °¨ Too much intake of anything at one time. °¨ Dairy products until 24 to 48 hours after diarrhea stops. °· You may consume probiotics. Probiotics are active cultures of beneficial bacteria. They may lessen the amount and number of diarrheal stools in adults. Probiotics can be found in yogurt with active cultures and in supplements. °· Wash your hands well to avoid spreading the bacteria. °· Take medicines only as directed by your health care provider. Do not give your child aspirin because of the association with Reye's syndrome. °· Ask your health care provider if you should continue to take your regular prescribed and over-the-counter medicines. °PREVENTION  °· Wash your hands, food preparation surfaces, and utensils thoroughly before and after handling raw foods. °· Keep refrigerated foods below 40°F (5°C). °· Serve hot foods immediately or keep them heated above 140°F (60°C). °· Divide large volumes of food into small portions for rapid cooling in the refrigerator. Hot, bulky foods in the refrigerator can raise the temperature of other foods that have already cooled. °· Follow approved canning procedures. °· Heat canned foods thoroughly before tasting. °· When in doubt, throw it out. °· Infants, the elderly, women   who are pregnant, and people with compromised immune systems are especially susceptible to food poisoning. These people  should never consume unpasteurized cheese, unpasteurized cider, raw fish, raw seafood, or raw meat-type products. °SEEK IMMEDIATE MEDICAL CARE IF:  °· You have difficulty breathing, swallowing, talking, or moving. °· You develop blurred vision. °· You are unable to keep fluids down. °· You faint or nearly faint. °· Your eyes turn yellow. °· Vomiting or diarrhea develops or becomes persistent. °· Abdominal pain develops, increases, or localizes in one small area. °· You have a fever. °· The diarrhea becomes excessive or contains blood or mucus. °· You develop excessive weakness, dizziness, or extreme thirst. °· You have no urine for 8 hours. °MAKE SURE YOU:  °· Understand these instructions. °· Will watch your condition. °· Will get help right away if you are not doing well or get worse. °Document Released: 07/30/2004 Document Revised: 03/18/2014 Document Reviewed: 03/18/2011 °Exit[employer]® Patient Information ©2015 [employer]. This information is not intended to replace advice given to you by your health care provider. Make sure you discuss any questions you have with your health care provider. ° °

## 2015-08-15 NOTE — Progress Notes (Signed)
Pre visit review using our clinic review tool, if applicable. No additional management support is needed unless otherwise documented below in the visit note. 

## 2016-05-25 ENCOUNTER — Other Ambulatory Visit: Payer: Self-pay | Admitting: *Deleted

## 2016-05-25 MED ORDER — FLUOXETINE HCL 20 MG PO TABS: 20.0000 mg | ORAL_TABLET | Freq: Every day | ORAL | Status: DC

## 2016-08-13 ENCOUNTER — Ambulatory Visit (INDEPENDENT_AMBULATORY_CARE_PROVIDER_SITE_OTHER): Payer: BLUE CROSS/BLUE SHIELD | Admitting: Internal Medicine

## 2016-08-13 ENCOUNTER — Encounter: Payer: Self-pay | Admitting: Internal Medicine

## 2016-08-13 DIAGNOSIS — Z0000 Encounter for general adult medical examination without abnormal findings: Secondary | ICD-10-CM

## 2016-08-13 DIAGNOSIS — F959 Tic disorder, unspecified: Secondary | ICD-10-CM | POA: Diagnosis not present

## 2016-08-13 MED ORDER — FLUOXETINE HCL 20 MG PO TABS: 20.0000 mg | ORAL_TABLET | Freq: Every day | ORAL | 3 refills | Status: AC

## 2016-08-13 NOTE — Progress Notes (Signed)
   Subjective:    Patient ID: Melina FiddlerJames A Bennis, male    DOB: [birth year], 25 y.o.   MRN: [id number]  HPI The patient is a 25 YO man coming in for wellness. No new concerns.   [facility], social history reviewed and updated.   Review of Systems  Constitutional: Negative for activity change, appetite change, fatigue, fever and unexpected weight change.  HENT: Negative.   Eyes: Negative.   Respiratory: Negative.   Cardiovascular: Negative.   Gastrointestinal: Negative.   Musculoskeletal: Negative.   Skin: Negative.   Neurological: Negative.   Psychiatric/Behavioral: Negative.       Objective:   Physical Exam  Constitutional: He is oriented to person, place, and time. He appears well-developed and well-nourished.  No tic during our conversation  HENT:  Head: Normocephalic and atraumatic.  Cardiovascular: Normal rate and regular rhythm.   Pulmonary/Chest: Effort normal and breath sounds normal.  Abdominal: Soft. He exhibits no distension. There is no tenderness. There is no rebound.  Musculoskeletal: He exhibits no edema.  Neurological: He is alert and oriented to person, place, and time. Coordination normal.  Skin: Skin is warm and dry.  Psychiatric: He has a normal mood and affect.   Vitals:   08/13/16 0937  BP: 136/66  Pulse: (!) 112  Resp: 16  Temp: 98.4 F (36.9 C)  TempSrc: Oral  SpO2: 98%  Weight: 141 lb 12.8 oz (64.3 kg)  Height: 5\' 9"  (1.753 m)      Assessment & Plan:

## 2016-08-13 NOTE — Patient Instructions (Signed)
We have sent in the refill today.   Think about doing some exercise like walking 30 minutes a day 3 times per week to stay healthy.   Health Maintenance, Male A healthy lifestyle and preventative care can promote health and wellness.  Maintain regular health, dental, and eye exams.  Eat a healthy diet. Foods like vegetables, fruits, whole grains, low-fat dairy products, and lean protein foods contain the nutrients you need and are low in calories. Decrease your intake of foods high in solid fats, added sugars, and salt. Get information about a proper diet from your health care provider, if necessary.  Regular physical exercise is one of the most important things you can do for your health. Most adults should get at least 150 minutes of moderate-intensity exercise (any activity that increases your heart rate and causes you to sweat) each week. In addition, most adults need muscle-strengthening exercises on 2 or more days a week.   Maintain a healthy weight. The body mass index (BMI) is a screening tool to identify possible weight problems. It provides an estimate of body fat based on height and weight. Your health care provider can find your BMI and can help you achieve or maintain a healthy weight. For males 20 years and older:  A BMI below 18.5 is considered underweight.  A BMI of 18.5 to 24.9 is normal.  A BMI of 25 to 29.9 is considered overweight.  A BMI of 30 and above is considered obese.  Maintain normal blood lipids and cholesterol by exercising and minimizing your intake of saturated fat. Eat a balanced diet with plenty of fruits and vegetables. Blood tests for lipids and cholesterol should begin at age 26 and be repeated every 5 years. If your lipid or cholesterol levels are high, you are over age 26, or you are at high risk for heart disease, you may need your cholesterol levels checked more frequently.Ongoing high lipid and cholesterol levels should be treated with medicines if  diet and exercise are not working.  If you smoke, find out from your health care provider how to quit. If you do not use tobacco, do not start.  Lung cancer screening is recommended for adults aged 55-80 years who are at high risk for developing lung cancer because of a history of smoking. A yearly low-dose CT scan of the lungs is recommended for people who have at least a 30-pack-year history of smoking and are current smokers or have quit within the past 15 years. A pack year of smoking is smoking an average of 1 pack of cigarettes a day for 1 year (for example, a 30-pack-year history of smoking could mean smoking 1 pack a day for 30 years or 2 packs a day for 15 years). Yearly screening should continue until the smoker has stopped smoking for at least 15 years. Yearly screening should be stopped for people who develop a health problem that would prevent them from having lung cancer treatment.  If you choose to drink alcohol, do not have more than 2 drinks per day. One drink is considered to be 12 oz (360 mL) of beer, 5 oz (150 mL) of wine, or 1.5 oz (45 mL) of liquor.  Avoid the use of street drugs. Do not share needles with anyone. Ask for help if you need support or instructions about stopping the use of drugs.  High blood pressure causes heart disease and increases the risk of stroke. High blood pressure is more likely to develop in:  People who have blood pressure in the end of the normal range (100-139/85-89 mm Hg).  People who are overweight or obese.  People who are African American.  If you are 60-4 years of age, have your blood pressure checked every 3-5 years. If you are 65 years of age or older, have your blood pressure checked every year. You should have your blood pressure measured twice--once when you are at a hospital or clinic, and once when you are not at a hospital or clinic. Record the average of the two measurements. To check your blood pressure when you are not at a  hospital or clinic, you can use:  An automated blood pressure machine at a pharmacy.  A home blood pressure monitor.  If you are 12-49 years old, ask your health care provider if you should take aspirin to prevent heart disease.  Diabetes screening involves taking a blood sample to check your fasting blood sugar level. This should be done once every 3 years after age 42 if you are at a normal weight and without risk factors for diabetes. Testing should be considered at a younger age or be carried out more frequently if you are overweight and have at least 1 risk factor for diabetes.  Colorectal cancer can be detected and often prevented. Most routine colorectal cancer screening begins at the age of 56 and continues through age 41. However, your health care provider may recommend screening at an earlier age if you have risk factors for colon cancer. On a yearly basis, your health care provider may provide home test kits to check for hidden blood in the stool. A small camera at the end of a tube may be used to directly examine the colon (sigmoidoscopy or colonoscopy) to detect the earliest forms of colorectal cancer. Talk to your health care provider about this at age 84 when routine screening begins. A direct exam of the colon should be repeated every 5-10 years through age 60, unless early forms of precancerous polyps or small growths are found.  People who are at an increased risk for hepatitis B should be screened for this virus. You are considered at high risk for hepatitis B if:  You were born in a country where hepatitis B occurs often. Talk with your health care provider about which countries are considered high risk.  Your parents were born in a high-risk country and you have not received a shot to protect against hepatitis B (hepatitis B vaccine).  You have HIV or AIDS.  You use needles to inject street drugs.  You live with, or have sex with, someone who has hepatitis B.  You are a  man who has sex with other men (MSM).  You get hemodialysis treatment.  You take certain medicines for conditions like cancer, organ transplantation, and autoimmune conditions.  Hepatitis C blood testing is recommended for all people born from [birth year] through 1965 and any individual with known risk factors for hepatitis C.  Healthy men should no longer receive prostate-specific antigen (PSA) blood tests as part of routine cancer screening. Talk to your health care provider about prostate cancer screening.  Testicular cancer screening is not recommended for adolescents or adult males who have no symptoms. Screening includes self-exam, a health care provider exam, and other screening tests. Consult with your health care provider about any symptoms you have or any concerns you have about testicular cancer.  Practice safe sex. Use condoms and avoid high-risk sexual practices to reduce the spread of  sexually transmitted infections (STIs).  You should be screened for STIs, including gonorrhea and chlamydia if:  You are sexually active and are younger than 24 years.  You are older than 24 years, and your health care provider tells you that you are at risk for this type of infection.  Your sexual activity has changed since you were last screened, and you are at an increased risk for chlamydia or gonorrhea. Ask your health care provider if you are at risk.  If you are at risk of being infected with HIV, it is recommended that you take a prescription medicine daily to prevent HIV infection. This is called pre-exposure prophylaxis (PrEP). You are considered at risk if:  You are a man who has sex with other men (MSM).  You are a heterosexual man who is sexually active with multiple partners.  You take drugs by injection.  You are sexually active with a partner who has HIV.  Talk with your health care provider about whether you are at high risk of being infected with HIV. If you choose to begin PrEP,  you should first be tested for HIV. You should then be tested every 3 months for as long as you are taking PrEP.  Use sunscreen. Apply sunscreen liberally and repeatedly throughout the day. You should seek shade when your shadow is shorter than you. Protect yourself by wearing long sleeves, pants, a wide-brimmed hat, and sunglasses year round whenever you are outdoors.  Tell your health care provider of new moles or changes in moles, especially if there is a change in shape or color. Also, tell your health care provider if a mole is larger than the size of a pencil eraser.  A one-time screening for abdominal aortic aneurysm (AAA) and surgical repair of large AAAs by ultrasound is recommended for men aged 78-75 years who are current or former smokers.  Stay current with your vaccines (immunizations).   This information is not intended to replace advice given to you by your health care provider. Make sure you discuss any questions you have with your health care provider.   Document Released: 04/29/2008 Document Revised: 11/22/2014 Document Reviewed: 03/29/2011 Elsevier Interactive Patient Education [employer].

## 2016-08-13 NOTE — Assessment & Plan Note (Signed)
Declines flu shot, not exercising and counseled about that and sun safety with mole surveillance. Given screening recommendations and counseled about distracted driving.

## 2016-08-13 NOTE — Assessment & Plan Note (Signed)
Stable on prozac.  

## 2016-08-13 NOTE — Progress Notes (Signed)
Pre visit review using our clinic review tool, if applicable. No additional management support is needed unless otherwise documented below in the visit note. 

## 2019-08-18 ENCOUNTER — Encounter (INDEPENDENT_AMBULATORY_CARE_PROVIDER_SITE_OTHER): Payer: Self-pay

## 2019-08-18 ENCOUNTER — Telehealth: Payer: BLUE CROSS/BLUE SHIELD | Admitting: Nurse Practitioner

## 2019-08-18 DIAGNOSIS — Z20822 Contact with and (suspected) exposure to covid-19: Secondary | ICD-10-CM

## 2019-08-18 MED ORDER — BENZONATATE 100 MG PO CAPS: 100.0000 mg | ORAL_CAPSULE | Freq: Three times a day (TID) | ORAL | 0 refills | Status: DC | PRN

## 2019-08-18 NOTE — Progress Notes (Signed)
E-Visit for Corona Virus Screening   Your current symptoms could be consistent with the coronavirus.  Many health care providers can now test patients at their office but not all are.  [facility] has multiple testing sites. For information on our COVID testing locations and hours go to [email or web address]  Please quarantine yourself while awaiting your test results.  We are enrolling you in our MyChart Home Montioring for COVID19 . Daily you will receive a questionnaire within the MyChart website. Our COVID 19 response team willl be monitoriing your responses daily.  You can go to one of the  testing sites listed below, while they are opened (see hours). You do not need a doctors order to be tested for covid.You do need to self-isolate until your results return and if positive 14 days from when your symptoms started and until you are 3 days symptom free.   Testing Locations (Monday - Friday, 8 a.m. - 3:30 p.m.) . [place]: [facility], [address], [place], [place]  . [place]: [facility], [address], [place], [place] (entrance off [address])  . [place]: [address], [place], [place] (across from  Emergency Department)    COVID-19 is a respiratory illness with symptoms that are similar to the flu. Symptoms are typically mild to moderate, but there have been cases of severe illness and death due to the virus. The following symptoms may appear 2-14 days after exposure: . Fever . Cough . Shortness of breath or difficulty breathing . Chills . Repeated shaking with chills . Muscle pain . Headache . Sore throat . New loss of taste or smell . Fatigue . Congestion or runny nose . Nausea or vomiting . Diarrhea  It is vitally important that if you feel that you have an infection such as this virus or any other virus that you stay home and away from places where you may  spread it to others.  You should self-quarantine for 14 days if you have symptoms that could potentially be coronavirus or have been in close contact a with a person diagnosed with COVID-19 within the last 2 weeks. You should avoid contact with people age 65 and older.   You should wear a mask or cloth face covering over your nose and mouth if you must be around other people or animals, including pets (even at home). Try to stay at least 6 feet away from other people. This will protect the people around you.  You can use medication such as A prescription cough medication called Tessalon Perles 100 mg. You may take 1-2 capsules every 8 hours as needed for cough  You may also take acetaminophen (Tylenol) as needed for fever.   Reduce your risk of any infection by using the same precautions used for avoiding the common cold or flu:  . Wash your hands often with soap and warm water for at least 20 seconds.  If soap and water are not readily available, use an alcohol-based hand sanitizer with at least 60% alcohol.  . If coughing or sneezing, cover your mouth and nose by coughing or sneezing into the elbow areas of your shirt or coat, into a tissue or into your sleeve (not your hands). . Avoid shaking hands with others and consider head nods or verbal greetings only. . Avoid touching your eyes, nose, or mouth with unwashed hands.  . Avoid close contact with people who are sick. . Avoid places or   events with large numbers of people in one location, like concerts or sporting events. . Carefully consider travel plans you have or are making. . If you are planning any travel outside or inside the [place], visit the CDC's Travelers' Health webpage for the latest health notices. . If you have some symptoms but not all symptoms, continue to monitor at home and seek medical attention if your symptoms worsen. . If you are having a medical emergency, call 911.  HOME CARE . Only take medications as instructed by your  medical team. . Drink plenty of fluids and get plenty of rest. . A steam or ultrasonic humidifier can help if you have congestion.   GET HELP RIGHT AWAY IF YOU HAVE EMERGENCY WARNING SIGNS** FOR COVID-19. If you or someone is showing any of these signs seek emergency medical care immediately. Call 911 or proceed to your closest emergency facility if: . You develop worsening high fever. . Trouble breathing . Bluish lips or face . Persistent pain or pressure in the chest . New confusion . Inability to wake or stay awake . You cough up blood. . Your symptoms become more severe  **This list is not all possible symptoms. Contact your medical provider for any symptoms that are sever or concerning to you.   MAKE SURE YOU   Understand these instructions.  Will watch your condition.  Will get help right away if you are not doing well or get worse.  Your e-visit answers were reviewed by a board certified advanced clinical practitioner to complete your personal care plan.  Depending on the condition, your plan could have included both over the counter or prescription medications.  If there is a problem please reply once you have received a response from your provider.  Your safety is important to us.  If you have drug allergies check your prescription carefully.    You can use MyChart to ask questions about today's visit, request a non-urgent call back, or ask for a work or school excuse for 24 hours related to this e-Visit. If it has been greater than 24 hours you will need to follow up with your provider, or enter a new e-Visit to address those concerns. You will get an e-mail in the next two days asking about your experience.  I hope that your e-visit has been valuable and will speed your recovery. Thank you for using e-visits.   5-10 minutes spent reviewing and documenting in chart.  

## 2019-08-20 ENCOUNTER — Other Ambulatory Visit: Payer: Self-pay | Admitting: *Deleted

## 2019-08-20 ENCOUNTER — Encounter (INDEPENDENT_AMBULATORY_CARE_PROVIDER_SITE_OTHER): Payer: Self-pay

## 2019-08-20 ENCOUNTER — Telehealth: Payer: Self-pay | Admitting: Internal Medicine

## 2019-08-20 DIAGNOSIS — Z20822 Contact with and (suspected) exposure to covid-19: Secondary | ICD-10-CM

## 2019-08-20 NOTE — Telephone Encounter (Signed)
Called pt after receiving BPA for worsening cough, via MyChart COVID symptom monitoring. Pt states he did not have any cough yesterday "And I coughed 2-3 times today." Non-productive, denies SOB. States he has not picked up the tessalon ordered 10/3 but will do so this evening. Care advise given per protocol.

## 2019-08-21 ENCOUNTER — Encounter (INDEPENDENT_AMBULATORY_CARE_PROVIDER_SITE_OTHER): Payer: Self-pay

## 2019-08-21 NOTE — Telephone Encounter (Signed)
Have not seen patient in more than 3 years so cannot really give advice.

## 2019-08-21 NOTE — Telephone Encounter (Signed)
Noted  

## 2019-08-22 ENCOUNTER — Encounter (INDEPENDENT_AMBULATORY_CARE_PROVIDER_SITE_OTHER): Payer: Self-pay

## 2019-08-22 LAB — NOVEL CORONAVIRUS, NAA: SARS-CoV-2, NAA: NOT DETECTED

## 2019-08-24 ENCOUNTER — Encounter (INDEPENDENT_AMBULATORY_CARE_PROVIDER_SITE_OTHER): Payer: Self-pay

## 2019-08-27 ENCOUNTER — Encounter (INDEPENDENT_AMBULATORY_CARE_PROVIDER_SITE_OTHER): Payer: Self-pay

## 2019-08-31 ENCOUNTER — Encounter (INDEPENDENT_AMBULATORY_CARE_PROVIDER_SITE_OTHER): Payer: Self-pay

## 2019-09-24 ENCOUNTER — Other Ambulatory Visit: Payer: Self-pay

## 2019-09-24 DIAGNOSIS — Z20822 Contact with and (suspected) exposure to covid-19: Secondary | ICD-10-CM

## 2019-09-28 LAB — NOVEL CORONAVIRUS, NAA: SARS-CoV-2, NAA: NOT DETECTED

## 2019-12-13 ENCOUNTER — Ambulatory Visit: Payer: BC Managed Care – PPO | Attending: Internal Medicine

## 2019-12-13 DIAGNOSIS — Z20822 Contact with and (suspected) exposure to covid-19: Secondary | ICD-10-CM

## 2019-12-14 LAB — NOVEL CORONAVIRUS, NAA: SARS-CoV-2, NAA: NOT DETECTED

## 2020-02-10 ENCOUNTER — Telehealth: Payer: BC Managed Care – PPO | Admitting: Family

## 2020-02-10 DIAGNOSIS — Z20822 Contact with and (suspected) exposure to covid-19: Secondary | ICD-10-CM

## 2020-02-10 MED ORDER — BENZONATATE 100 MG PO CAPS: 100.0000 mg | ORAL_CAPSULE | Freq: Three times a day (TID) | ORAL | 0 refills | Status: AC | PRN

## 2020-02-10 MED ORDER — ALBUTEROL SULFATE HFA 108 (90 BASE) MCG/ACT IN AERS: 2.0000 | INHALATION_SPRAY | Freq: Four times a day (QID) | RESPIRATORY_TRACT | 0 refills | Status: AC | PRN

## 2020-02-10 NOTE — Progress Notes (Signed)
E-Visit for Corona Virus Screening  Your current symptoms could be consistent with the coronavirus.  Many health care providers can now test patients at their office but not all are.  [place] has multiple testing sites. For information on our COVID testing locations and hours go to [email or web address]  We are enrolling you in our MyChart Home Monitoring for COVID19 . Daily you will receive a questionnaire within the MyChart website. Our COVID 19 response team will be monitoring your responses daily.  Testing Information: The COVID-19 Community Testing sites will begin testing BY APPOINTMENT ONLY.  You can schedule online at [email or web address]  If you do not have access to a smart phone or computer you may call [phone] for an appointment.   Additional testing sites in the Community:  . For [facility] Testing sites in [facility]  [email or web address]  . For Pop-up testing sites in [place]  [email or web address]  . For Testing sites with regular hours [email or web address]  . For [facility] [email or web address]  . For [facility] [email or web address]  . For [facility] testing in [place] and [employer] [email or web address]  . For [facility] testing in [facility]   [email or web address]  For  more information about community testing call [phone]   Please quarantine yourself while awaiting your test results. Please stay home for a minimum of 10 days from the first day of illness with improving symptoms and you have had 24 hours of no fever (without the use of Tylenol (Acetaminophen)  Motrin (Ibuprofen) or any fever reducing medication).  Also - Do not get tested prior to returning to work because once you have had a positive test the test can stay positive for more then a month in some cases.   You should wear a mask or cloth face covering over your nose and mouth if you must be around other people or animals, including pets (even at home). Try to stay at least 6 feet away from other people. This will protect the people around you.  Please continue good preventive care measures, including:  frequent hand-washing, avoid touching your face, cover coughs/sneezes, stay out of crowds and keep a 6 foot distance from others.  COVID-19 is a respiratory illness with symptoms that are similar to the flu. Symptoms are typically mild to moderate, but there have been cases of severe illness and death due to the virus.   The following symptoms may appear 2-14 days after exposure: . Fever . Cough . Shortness of breath or difficulty breathing . Chills . Repeated shaking with chills . Muscle pain . Headache . Sore throat . New loss of taste or smell . Fatigue . Congestion or runny nose . Nausea or vomiting . Diarrhea  Go to the nearest hospital ED for assessment if fever/cough/breathlessness are severe or illness seems like a threat to life.  It is vitally important that if you feel that you have an infection such as this virus or any other virus that you stay home and away from places where you may spread it to others.  You should avoid contact with people age 74 and older.   You can use medication such as A prescription cough medication called Tessalon Perles 100 mg. You may take 1-2 capsules every 8 hours as needed for cough and A prescription inhaler called Albuterol MDI 90 mcg /actuation 2 puffs every 4 hours as needed for shortness of breath, wheezing, cough.   I would not recommend retesting if it is negative.  You may also take acetaminophen (Tylenol) as needed for  fever.  Reduce your risk of any infection by using the same precautions used for avoiding the common cold or flu:  [place] Wash your hands often with soap and warm water for at least 20 seconds.  If soap and water are not readily available, use an alcohol-based hand sanitizer with at least 60% alcohol.  . If coughing or sneezing, cover your mouth and nose by coughing or sneezing into the elbow areas of your shirt or coat, into a tissue or into your sleeve (not your hands). . Avoid shaking hands with others and consider head nods or verbal greetings only. . Avoid touching your eyes, nose, or mouth with unwashed hands.  . Avoid close contact with people who are sick. . Avoid places or events with large numbers of people in one location, like concerts or sporting events. . Carefully consider travel plans you have or are making. . If you are planning any travel outside or inside the [place], visit the CDC's Travelers' Health webpage for the latest health notices. . If you have some symptoms but not all symptoms, continue to monitor at home and seek medical attention if your symptoms worsen. . If you are having a medical emergency, call 911.  HOME CARE . Only take medications as instructed by your medical team. . Drink plenty of fluids and get plenty of rest. . A steam or ultrasonic humidifier can help if you have congestion.   GET HELP RIGHT AWAY IF YOU HAVE EMERGENCY WARNING SIGNS** FOR COVID-19. If you or someone is showing any of these signs seek emergency medical care immediately. Call 911 or proceed to your closest emergency facility if: . You develop worsening high fever. . Trouble breathing . Bluish lips or face . Persistent pain or pressure in the chest . New confusion . Inability to wake or stay awake . You cough up blood. . Your symptoms become more severe  **This list is not all possible symptoms. Contact your medical provider for any symptoms that are sever or concerning to you.  MAKE  SURE YOU   Understand these instructions.  Will watch your condition.  Will get help right away if you are not doing well or get worse.  Your e-visit answers were reviewed by a board certified advanced clinical practitioner to complete your personal care plan.  Depending on the condition, your plan could have included both over the counter or prescription medications.  If there is a problem please reply once you have received a response from your provider.  Your safety is important to [place].  If you have drug allergies check your prescription carefully.    You can use MyChart to ask questions about today's visit, request a non-urgent call back, or ask for a work or school excuse for 24 hours related to this e-Visit. If it has been greater than 24 hours you will need to follow up with your provider, or enter a new e-Visit to address those concerns. You will get an e-mail in the next two days asking about your experience.  I hope that your e-visit has been valuable and will speed your recovery. Thank you for using e-visits.  Approximately 5 minutes was spent documenting and reviewing patient's chart.

## 2022-04-28 ENCOUNTER — Encounter: Payer: Self-pay | Admitting: Internal Medicine

## 2023-09-08 ENCOUNTER — Other Ambulatory Visit (HOSPITAL_COMMUNITY): Payer: Self-pay

## 2023-09-08 MED ORDER — DEXTROAMPHETAMINE SULFATE ER 15 MG PO CP24
15.0000 mg | ORAL_CAPSULE | Freq: Every day | ORAL | 0 refills | Status: DC
Start: 2023-09-07 — End: 2023-10-04
  Filled 2023-09-08: qty 30, 30d supply, fill #0

## 2023-09-08 MED ORDER — GUANFACINE HCL ER 1 MG PO TB24
1.0000 mg | ORAL_TABLET | Freq: Every day | ORAL | 0 refills | Status: DC
  Filled 2023-09-08: qty 30, 30d supply, fill #0

## 2023-10-04 ENCOUNTER — Other Ambulatory Visit (HOSPITAL_COMMUNITY): Payer: Self-pay

## 2023-10-04 MED ORDER — DEXTROAMPHETAMINE SULFATE ER 15 MG PO CP24
15.0000 mg | ORAL_CAPSULE | Freq: Every day | ORAL | 0 refills | Status: DC
  Filled 2023-10-04 – 2023-10-13 (×3): qty 30, 30d supply, fill #0

## 2023-10-04 MED ORDER — GUANFACINE HCL ER 1 MG PO TB24
1.0000 mg | ORAL_TABLET | Freq: Every day | ORAL | 0 refills | Status: DC
Start: 2023-10-04 — End: 2023-10-31
  Filled 2023-10-04: qty 30, 30d supply, fill #0

## 2023-10-07 ENCOUNTER — Other Ambulatory Visit (HOSPITAL_COMMUNITY): Payer: Self-pay

## 2023-10-12 ENCOUNTER — Other Ambulatory Visit (HOSPITAL_COMMUNITY): Payer: Self-pay

## 2023-10-12 MED ORDER — LORAZEPAM 1 MG PO TABS
2.0000 mg | ORAL_TABLET | Freq: Every evening | ORAL | 0 refills | Status: AC
  Filled 2023-10-12: qty 4, 1d supply, fill #0

## 2023-10-14 ENCOUNTER — Other Ambulatory Visit (HOSPITAL_COMMUNITY): Payer: Self-pay

## 2023-10-18 ENCOUNTER — Other Ambulatory Visit (HOSPITAL_COMMUNITY): Payer: Self-pay

## 2023-10-18 MED ORDER — AMOXICILLIN 500 MG PO CAPS
500.0000 mg | ORAL_CAPSULE | Freq: Three times a day (TID) | ORAL | 0 refills | Status: AC
  Filled 2023-10-18: qty 21, 7d supply, fill #0

## 2023-10-19 ENCOUNTER — Other Ambulatory Visit (HOSPITAL_COMMUNITY): Payer: Self-pay

## 2023-10-28 ENCOUNTER — Other Ambulatory Visit (HOSPITAL_BASED_OUTPATIENT_CLINIC_OR_DEPARTMENT_OTHER): Payer: Self-pay

## 2023-10-31 ENCOUNTER — Other Ambulatory Visit (HOSPITAL_COMMUNITY): Payer: Self-pay

## 2023-10-31 MED ORDER — DEXTROAMPHETAMINE SULFATE ER 15 MG PO CP24: 15.0000 mg | ORAL_CAPSULE | Freq: Every day | ORAL | 0 refills | Status: AC

## 2023-10-31 MED ORDER — GUANFACINE HCL ER 1 MG PO TB24
1.0000 mg | ORAL_TABLET | Freq: Every day | ORAL | 5 refills | Status: DC
  Filled 2023-10-31: qty 30, 30d supply, fill #0
  Filled 2023-12-09: qty 30, 30d supply, fill #1
  Filled 2024-01-10: qty 30, 30d supply, fill #2
  Filled 2024-02-07: qty 30, 30d supply, fill #3
  Filled 2024-03-11: qty 30, 30d supply, fill #4
  Filled 2024-04-11: qty 30, 30d supply, fill #5

## 2023-11-02 ENCOUNTER — Other Ambulatory Visit (HOSPITAL_COMMUNITY): Payer: Self-pay

## 2023-11-02 MED ORDER — CEPHALEXIN 500 MG PO CAPS
500.0000 mg | ORAL_CAPSULE | Freq: Four times a day (QID) | ORAL | 0 refills | Status: AC
  Filled 2023-11-02: qty 28, 7d supply, fill #0

## 2023-11-14 ENCOUNTER — Other Ambulatory Visit (HOSPITAL_COMMUNITY): Payer: Self-pay

## 2023-11-14 MED ORDER — DEXTROAMPHETAMINE SULFATE ER 15 MG PO CP24
15.0000 mg | ORAL_CAPSULE | Freq: Every day | ORAL | 0 refills | Status: AC
Start: 2023-11-14 — End: ?
  Filled 2023-11-14: qty 30, 30d supply, fill #0

## 2023-11-15 ENCOUNTER — Other Ambulatory Visit (HOSPITAL_COMMUNITY): Payer: Self-pay

## 2023-12-09 ENCOUNTER — Other Ambulatory Visit (HOSPITAL_COMMUNITY): Payer: Self-pay

## 2023-12-14 ENCOUNTER — Other Ambulatory Visit (HOSPITAL_COMMUNITY): Payer: Self-pay

## 2023-12-14 MED ORDER — DEXTROAMPHETAMINE SULFATE ER 15 MG PO CP24
15.0000 mg | ORAL_CAPSULE | Freq: Every day | ORAL | 0 refills | Status: AC
  Filled 2023-12-14: qty 30, 30d supply, fill #0

## 2023-12-19 ENCOUNTER — Other Ambulatory Visit (HOSPITAL_COMMUNITY): Payer: Self-pay

## 2024-01-16 ENCOUNTER — Other Ambulatory Visit (HOSPITAL_COMMUNITY): Payer: Self-pay

## 2024-01-16 MED ORDER — DEXTROAMPHETAMINE SULFATE ER 15 MG PO CP24
15.0000 mg | ORAL_CAPSULE | Freq: Every day | ORAL | 0 refills | Status: AC
  Filled 2024-01-16 – 2024-01-18 (×2): qty 30, 30d supply, fill #0

## 2024-01-18 ENCOUNTER — Other Ambulatory Visit (HOSPITAL_COMMUNITY): Payer: Self-pay

## 2024-01-18 ENCOUNTER — Other Ambulatory Visit: Payer: Self-pay

## 2024-02-15 ENCOUNTER — Other Ambulatory Visit (HOSPITAL_COMMUNITY): Payer: Self-pay

## 2024-02-15 MED ORDER — DEXTROAMPHETAMINE SULFATE ER 15 MG PO CP24
15.0000 mg | ORAL_CAPSULE | Freq: Every day | ORAL | 0 refills | Status: AC
  Filled 2024-02-15: qty 30, 30d supply, fill #0

## 2024-02-16 ENCOUNTER — Other Ambulatory Visit (HOSPITAL_COMMUNITY): Payer: Self-pay

## 2024-03-14 ENCOUNTER — Other Ambulatory Visit (HOSPITAL_COMMUNITY): Payer: Self-pay

## 2024-03-14 MED ORDER — DEXTROAMPHETAMINE SULFATE ER 15 MG PO CP24
15.0000 mg | ORAL_CAPSULE | Freq: Every day | ORAL | 0 refills | Status: AC
  Filled 2024-03-14 – 2024-03-16 (×2): qty 30, 30d supply, fill #0

## 2024-03-16 ENCOUNTER — Other Ambulatory Visit (HOSPITAL_COMMUNITY): Payer: Self-pay

## 2024-03-23 ENCOUNTER — Other Ambulatory Visit (HOSPITAL_COMMUNITY): Payer: Self-pay

## 2024-03-23 MED ORDER — SPIRONOLACTONE 25 MG PO TABS
50.0000 mg | ORAL_TABLET | Freq: Two times a day (BID) | ORAL | 1 refills | Status: AC
  Filled 2024-03-23: qty 120, 30d supply, fill #0
  Filled 2024-04-17: qty 120, 30d supply, fill #1

## 2024-03-23 MED ORDER — FINASTERIDE 5 MG PO TABS
5.0000 mg | ORAL_TABLET | Freq: Every day | ORAL | 2 refills | Status: AC
  Filled 2024-03-23: qty 30, 30d supply, fill #0
  Filled 2024-06-30: qty 30, 30d supply, fill #1

## 2024-03-23 MED ORDER — ESTRADIOL 2 MG PO TABS
2.0000 mg | ORAL_TABLET | Freq: Every day | ORAL | 1 refills | Status: DC
Start: 2024-03-23 — End: 2024-05-03
  Filled 2024-03-23: qty 30, 30d supply, fill #0
  Filled 2024-04-17: qty 30, 30d supply, fill #1

## 2024-04-16 ENCOUNTER — Other Ambulatory Visit (HOSPITAL_COMMUNITY): Payer: Self-pay

## 2024-04-16 MED ORDER — DEXTROAMPHETAMINE SULFATE ER 15 MG PO CP24
15.0000 mg | ORAL_CAPSULE | Freq: Every day | ORAL | 0 refills | Status: AC
  Filled 2024-04-16 – 2024-04-17 (×2): qty 30, 30d supply, fill #0

## 2024-04-17 ENCOUNTER — Other Ambulatory Visit (HOSPITAL_COMMUNITY): Payer: Self-pay

## 2024-04-30 ENCOUNTER — Other Ambulatory Visit (HOSPITAL_COMMUNITY): Payer: Self-pay

## 2024-04-30 MED ORDER — LISDEXAMFETAMINE DIMESYLATE 30 MG PO CAPS
30.0000 mg | ORAL_CAPSULE | Freq: Every day | ORAL | 0 refills | Status: AC
  Filled 2024-04-30: qty 30, 30d supply, fill #0

## 2024-05-03 ENCOUNTER — Other Ambulatory Visit (HOSPITAL_COMMUNITY): Payer: Self-pay

## 2024-05-03 MED ORDER — ESTRADIOL 2 MG PO TABS
6.0000 mg | ORAL_TABLET | Freq: Every day | ORAL | 0 refills | Status: AC
  Filled 2024-05-03: qty 90, 30d supply, fill #0
  Filled 2024-06-03: qty 90, 30d supply, fill #1
  Filled 2024-06-30: qty 90, 30d supply, fill #2

## 2024-05-03 MED ORDER — FINASTERIDE 5 MG PO TABS
5.0000 mg | ORAL_TABLET | Freq: Every day | ORAL | 1 refills | Status: AC
  Filled 2024-05-03: qty 30, 30d supply, fill #0
  Filled 2024-06-03: qty 30, 30d supply, fill #1

## 2024-05-03 MED ORDER — SPIRONOLACTONE 50 MG PO TABS
50.0000 mg | ORAL_TABLET | Freq: Two times a day (BID) | ORAL | 1 refills | Status: AC
  Filled 2024-05-03: qty 60, 30d supply, fill #0
  Filled 2024-06-03: qty 60, 30d supply, fill #1
  Filled 2024-06-30: qty 60, 30d supply, fill #2

## 2024-05-14 ENCOUNTER — Other Ambulatory Visit (HOSPITAL_COMMUNITY): Payer: Self-pay

## 2024-05-14 MED ORDER — GUANFACINE HCL ER 1 MG PO TB24
1.0000 mg | ORAL_TABLET | Freq: Every day | ORAL | 5 refills | Status: AC
  Filled 2024-05-14: qty 30, 30d supply, fill #0
  Filled 2024-06-12: qty 30, 30d supply, fill #1
  Filled 2024-07-16: qty 30, 30d supply, fill #2

## 2024-05-26 ENCOUNTER — Other Ambulatory Visit (HOSPITAL_COMMUNITY): Payer: Self-pay

## 2024-05-26 MED ORDER — LISDEXAMFETAMINE DIMESYLATE 40 MG PO CAPS
40.0000 mg | ORAL_CAPSULE | Freq: Every day | ORAL | 0 refills | Status: DC
  Filled 2024-05-26: qty 30, 30d supply, fill #0

## 2024-06-22 ENCOUNTER — Other Ambulatory Visit (HOSPITAL_COMMUNITY): Payer: Self-pay

## 2024-06-22 MED ORDER — LISDEXAMFETAMINE DIMESYLATE 40 MG PO CAPS
40.0000 mg | ORAL_CAPSULE | Freq: Every day | ORAL | 0 refills | Status: DC
  Filled 2024-06-22 – 2024-06-23 (×2): qty 30, 30d supply, fill #0

## 2024-06-23 ENCOUNTER — Other Ambulatory Visit (HOSPITAL_COMMUNITY): Payer: Self-pay

## 2024-07-24 ENCOUNTER — Other Ambulatory Visit (HOSPITAL_COMMUNITY): Payer: Self-pay

## 2024-07-24 MED ORDER — LISDEXAMFETAMINE DIMESYLATE 40 MG PO CAPS
40.0000 mg | ORAL_CAPSULE | Freq: Every day | ORAL | 0 refills | Status: DC
  Filled 2024-07-24: qty 30, 30d supply, fill #0

## 2024-07-31 ENCOUNTER — Other Ambulatory Visit (HOSPITAL_COMMUNITY): Payer: Self-pay

## 2024-07-31 MED ORDER — CLONIDINE HCL ER 0.1 MG PO TB12
0.1000 mg | ORAL_TABLET | Freq: Two times a day (BID) | ORAL | 0 refills | Status: DC
  Filled 2024-07-31: qty 60, 30d supply, fill #0

## 2024-08-02 ENCOUNTER — Other Ambulatory Visit (HOSPITAL_COMMUNITY): Payer: Self-pay

## 2024-08-02 MED ORDER — SPIRONOLACTONE 50 MG PO TABS
50.0000 mg | ORAL_TABLET | Freq: Two times a day (BID) | ORAL | 1 refills | Status: AC
  Filled 2024-08-02: qty 60, 30d supply, fill #0
  Filled 2024-08-31: qty 60, 30d supply, fill #1
  Filled 2024-10-01: qty 60, 30d supply, fill #2

## 2024-08-02 MED ORDER — FINASTERIDE 5 MG PO TABS
5.0000 mg | ORAL_TABLET | Freq: Every day | ORAL | 1 refills | Status: AC
  Filled 2024-08-02: qty 30, 30d supply, fill #0
  Filled 2024-08-31: qty 30, 30d supply, fill #1
  Filled 2024-10-01: qty 30, 30d supply, fill #2

## 2024-08-02 MED ORDER — ESTRADIOL 2 MG PO TABS
ORAL_TABLET | ORAL | 1 refills | Status: AC
  Filled 2024-08-02: qty 120, 30d supply, fill #0
  Filled 2024-08-31: qty 120, 30d supply, fill #1
  Filled 2024-10-01: qty 120, 30d supply, fill #2

## 2024-08-22 ENCOUNTER — Other Ambulatory Visit (HOSPITAL_COMMUNITY): Payer: Self-pay

## 2024-08-22 MED ORDER — CLONIDINE HCL ER 0.1 MG PO TB12
0.1000 mg | ORAL_TABLET | Freq: Two times a day (BID) | ORAL | 0 refills | Status: DC
  Filled 2024-08-22: qty 120, 30d supply, fill #0

## 2024-08-22 MED ORDER — LISDEXAMFETAMINE DIMESYLATE 40 MG PO CAPS
40.0000 mg | ORAL_CAPSULE | Freq: Every day | ORAL | 0 refills | Status: DC
  Filled 2024-08-22: qty 30, 30d supply, fill #0

## 2024-09-21 ENCOUNTER — Other Ambulatory Visit (HOSPITAL_COMMUNITY): Payer: Self-pay

## 2024-09-21 MED ORDER — LISDEXAMFETAMINE DIMESYLATE 40 MG PO CAPS
40.0000 mg | ORAL_CAPSULE | Freq: Every day | ORAL | 0 refills | Status: DC
  Filled 2024-09-21: qty 30, 30d supply, fill #0

## 2024-09-21 MED ORDER — CLONIDINE HCL ER 0.1 MG PO TB12
0.2000 mg | ORAL_TABLET | Freq: Two times a day (BID) | ORAL | 0 refills | Status: DC
  Filled 2024-09-21: qty 120, 30d supply, fill #0

## 2024-09-22 ENCOUNTER — Other Ambulatory Visit (HOSPITAL_COMMUNITY): Payer: Self-pay

## 2024-10-05 ENCOUNTER — Other Ambulatory Visit (HOSPITAL_COMMUNITY): Payer: Self-pay

## 2024-10-05 ENCOUNTER — Other Ambulatory Visit: Payer: Self-pay

## 2024-10-05 MED ORDER — ESTRADIOL VALERATE 20 MG/ML IM OIL
20.0000 mg | TOPICAL_OIL | INTRAMUSCULAR | 1 refills | Status: AC
  Filled 2024-10-05: qty 5, 30d supply, fill #0
  Filled 2024-10-30: qty 5, 30d supply, fill #1
  Filled 2024-12-02: qty 5, 30d supply, fill #2

## 2024-10-05 MED ORDER — SPIRONOLACTONE 50 MG PO TABS
50.0000 mg | ORAL_TABLET | Freq: Two times a day (BID) | ORAL | 1 refills | Status: AC
  Filled 2024-10-05: qty 180, 90d supply, fill #0
  Filled 2024-10-30: qty 60, 30d supply, fill #0
  Filled 2024-12-02: qty 60, 30d supply, fill #1

## 2024-10-05 MED ORDER — FINASTERIDE 5 MG PO TABS
5.0000 mg | ORAL_TABLET | Freq: Every day | ORAL | 1 refills | Status: AC
  Filled 2024-10-05: qty 90, 90d supply, fill #0
  Filled 2024-10-30: qty 30, 30d supply, fill #0
  Filled 2024-12-02: qty 30, 30d supply, fill #1

## 2024-10-08 ENCOUNTER — Other Ambulatory Visit (HOSPITAL_COMMUNITY): Payer: Self-pay

## 2024-10-22 ENCOUNTER — Other Ambulatory Visit (HOSPITAL_COMMUNITY): Payer: Self-pay

## 2024-10-22 MED ORDER — LISDEXAMFETAMINE DIMESYLATE 40 MG PO CAPS
40.0000 mg | ORAL_CAPSULE | Freq: Every day | ORAL | 0 refills | Status: AC
  Filled 2024-10-22: qty 30, 30d supply, fill #0

## 2024-10-22 MED ORDER — CLONIDINE HCL ER 0.1 MG PO TB12
ORAL_TABLET | ORAL | 0 refills | Status: DC
  Filled 2024-10-22: qty 120, 30d supply, fill #0

## 2024-10-30 ENCOUNTER — Other Ambulatory Visit (HOSPITAL_COMMUNITY): Payer: Self-pay

## 2024-10-30 ENCOUNTER — Other Ambulatory Visit: Payer: Self-pay

## 2024-11-22 ENCOUNTER — Other Ambulatory Visit (HOSPITAL_COMMUNITY): Payer: Self-pay

## 2024-11-22 MED ORDER — CLONIDINE HCL ER 0.1 MG PO TB12
0.1000 mg | ORAL_TABLET | Freq: Two times a day (BID) | ORAL | 5 refills | Status: AC
  Filled 2024-11-22: qty 120, 30d supply, fill #0
  Filled 2024-12-21: qty 120, 30d supply, fill #1

## 2024-11-22 MED ORDER — LISDEXAMFETAMINE DIMESYLATE 50 MG PO CAPS
50.0000 mg | ORAL_CAPSULE | Freq: Every day | ORAL | 0 refills | Status: DC
  Filled 2024-11-22: qty 30, 30d supply, fill #0

## 2024-11-23 ENCOUNTER — Other Ambulatory Visit (HOSPITAL_COMMUNITY): Payer: Self-pay

## 2024-12-20 ENCOUNTER — Other Ambulatory Visit (HOSPITAL_COMMUNITY): Payer: Self-pay

## 2024-12-20 MED ORDER — LISDEXAMFETAMINE DIMESYLATE 50 MG PO CAPS
50.0000 mg | ORAL_CAPSULE | Freq: Every day | ORAL | 0 refills | Status: AC
  Filled 2024-12-20: qty 30, 30d supply, fill #0

## 2024-12-21 ENCOUNTER — Other Ambulatory Visit (HOSPITAL_COMMUNITY): Payer: Self-pay
